# Patient Record
Sex: Female | Born: 1985 | Race: White | Hispanic: No | Marital: Married | Smoking: Never smoker
Health system: Southern US, Community
[De-identification: ages and names within clinical notes are randomized; demographics above are authoritative.]

## PROBLEM LIST (undated history)

## (undated) DIAGNOSIS — Z789 Other specified health status: Secondary | ICD-10-CM

## (undated) DIAGNOSIS — K219 Gastro-esophageal reflux disease without esophagitis: Secondary | ICD-10-CM

## (undated) HISTORY — PX: NO PAST SURGERIES: SHX2092

---

## 2010-08-12 ENCOUNTER — Inpatient Hospital Stay (HOSPITAL_COMMUNITY)
Admission: AD | Admit: 2010-08-12 | Discharge: 2010-08-12 | Payer: Self-pay | Source: Home / Self Care | Attending: Obstetrics and Gynecology | Admitting: Obstetrics and Gynecology

## 2010-08-12 LAB — URINALYSIS, ROUTINE W REFLEX MICROSCOPIC
Ketones, ur: >80 mg/dL — AB
Specific Gravity, Urine: >1.03 — ABNORMAL HIGH (ref 1.005–1.030)
Urine Glucose, Fasting: NEGATIVE mg/dL
pH: 6 (ref 5.0–8.0)

## 2010-08-12 LAB — URINE MICROSCOPIC-ADD ON

## 2010-10-17 ENCOUNTER — Inpatient Hospital Stay (HOSPITAL_COMMUNITY): Admission: AD | Admit: 2010-10-17 | Payer: Self-pay | Admitting: Obstetrics and Gynecology

## 2010-10-31 ENCOUNTER — Inpatient Hospital Stay (HOSPITAL_COMMUNITY)
Admission: AD | Admit: 2010-10-31 | Discharge: 2010-11-02 | DRG: 886 | Disposition: A | Discharge status: Home / Self Care | Payer: BC Managed Care – HMO | Source: Ambulatory Visit | Attending: Obstetrics and Gynecology | Admitting: Obstetrics and Gynecology

## 2010-10-31 ENCOUNTER — Inpatient Hospital Stay (HOSPITAL_COMMUNITY): Payer: BC Managed Care – HMO

## 2010-10-31 DIAGNOSIS — O26879 Cervical shortening, unspecified trimester: Secondary | ICD-10-CM

## 2010-10-31 DIAGNOSIS — O2390 Unspecified genitourinary tract infection in pregnancy, unspecified trimester: Secondary | ICD-10-CM

## 2010-10-31 DIAGNOSIS — N390 Urinary tract infection, site not specified: Secondary | ICD-10-CM

## 2010-10-31 LAB — URINALYSIS, ROUTINE W REFLEX MICROSCOPIC
Glucose, UA: NEGATIVE mg/dL
Ketones, ur: >80 mg/dL — AB
Protein, ur: NEGATIVE mg/dL
Urobilinogen, UA: 0.2 mg/dL (ref 0.0–1.0)

## 2010-10-31 LAB — FETAL FIBRONECTIN: Fetal Fibronectin: POSITIVE — AB

## 2010-10-31 LAB — WET PREP, GENITAL: Yeast Wet Prep HPF POC: NONE SEEN

## 2010-10-31 LAB — AMNISURE RUPTURE OF MEMBRANE (ROM) NOT AT ARMC: Amnisure ROM: NEGATIVE

## 2010-10-31 LAB — URINE MICROSCOPIC-ADD ON

## 2010-11-01 ENCOUNTER — Inpatient Hospital Stay (HOSPITAL_COMMUNITY): Payer: BC Managed Care – HMO

## 2010-11-01 LAB — URINE CULTURE

## 2010-11-02 ENCOUNTER — Inpatient Hospital Stay (HOSPITAL_COMMUNITY): Payer: BC Managed Care – HMO

## 2010-11-30 NOTE — Discharge Summary (Signed)
  NAMEARMIDA, Monique Hicks                 ACCOUNT NO.:  [id number]  MEDICAL RECORD NO.:  [id number]           PATIENT TYPE:  I  LOCATION:  9155                          FACILITY:  [facility]  PHYSICIAN:  Guy Sandifer. Henderson Cloud, M.D. DATE OF BIRTH:  May 02, 1986  DATE OF ADMISSION:  10/31/2010 DATE OF DISCHARGE:  11/02/2010                              DISCHARGE SUMMARY   ADMITTING DIAGNOSES: 1. Intrauterine pregnancy at 87 weeks' estimated additional age. 2. Shortened cervix.  DISCHARGE DIAGNOSES: 1. Intrauterine pregnancy at 26 weeks' estimated additional age. 2. Shortened cervix.  REASON FOR ADMISSION:  This patient is a 25 year old white female G3, P0 with an EDC of January 04, 2011 who over the weekend had a small amount of leaking of fluid.  No blood.  She then had some discomfort and presented to [facility] for evaluation.  While there she was noted to have positive fetal fibronectin and negative Amnisure.  Ultrasound revealed normal amniotic fluid and the cervical length of 2.2 cm with funneling. She was admitted for bedrest, observation, betamethasone.  She was given oral Procardia p.r.n. and oral Keflex for possible urinary tract infection.  HOSPITAL COURSE:  The patient remains stable and afebrile.  There were no complaints of further leaking.  Urine culture returns as negative. She receives both doses of betamethasone.  On November 02, 2010 ultrasound reveals a cervical length of 2.8 cm.  AFI was normal.  Fetal heart tones were reactive and there were no contractions.  CONDITION ON DISCHARGE:  Stable.  DIET:  Regular as tolerated.  MEDICATIONS:  Prenatal vitamins daily.  ACTIVITY:  Modified bedrest is carefully discussed with the patient. She will lie down at least 50% of the day.  Pelvic rest, no heavy lifting also emphasized.  Follow up in the office in 1 week.     Guy Sandifer Henderson Cloud, M.D.     JET/MEDQ  D:  11/02/2010  T:  11/02/2010  Job:  [id number]  Electronically Signed  by Harold Hedge M.D. on 11/30/2010 01:45:08 PM

## 2010-12-29 ENCOUNTER — Inpatient Hospital Stay (HOSPITAL_COMMUNITY)
Admission: AD | Admit: 2010-12-29 | Discharge: 2010-12-29 | Disposition: A | Discharge status: Home / Self Care | Payer: BC Managed Care – HMO | Source: Ambulatory Visit | Attending: Obstetrics and Gynecology | Admitting: Obstetrics and Gynecology

## 2010-12-29 DIAGNOSIS — O479 False labor, unspecified: Secondary | ICD-10-CM | POA: Insufficient documentation

## 2011-01-04 ENCOUNTER — Inpatient Hospital Stay (HOSPITAL_COMMUNITY)
Admission: AD | Admit: 2011-01-04 | Discharge: 2011-01-07 | DRG: 373 | Disposition: A | Discharge status: Home / Self Care | Payer: BC Managed Care – PPO | Source: Ambulatory Visit | Attending: Obstetrics and Gynecology | Admitting: Obstetrics and Gynecology

## 2011-01-04 ENCOUNTER — Encounter (HOSPITAL_COMMUNITY): Payer: Self-pay | Admitting: *Deleted

## 2011-01-04 LAB — CBC
HCT: 31.7 % — ABNORMAL LOW (ref 36.0–46.0)
Hemoglobin: 10.5 g/dL — ABNORMAL LOW (ref 12.0–15.0)
MCHC: 33.1 g/dL (ref 30.0–36.0)
MCV: 82.8 fL (ref 78.0–100.0)
WBC: 13.2 10*3/uL — ABNORMAL HIGH (ref 4.0–10.5)

## 2011-01-06 LAB — CBC
HCT: 27.5 % — ABNORMAL LOW (ref 36.0–46.0)
Hemoglobin: 9.2 g/dL — ABNORMAL LOW (ref 12.0–15.0)
MCV: 82.8 fL (ref 78.0–100.0)
WBC: 21.2 10*3/uL — ABNORMAL HIGH (ref 4.0–10.5)

## 2011-01-06 LAB — ABO/RH: ABO/RH(D): O POS

## 2011-03-14 ENCOUNTER — Emergency Department (HOSPITAL_COMMUNITY)
Admission: EM | Admit: 2011-03-14 | Discharge: 2011-03-14 | Disposition: A | Discharge status: Home / Self Care | Payer: BC Managed Care – PPO | Attending: Emergency Medicine | Admitting: Emergency Medicine

## 2011-03-14 DIAGNOSIS — K5289 Other specified noninfective gastroenteritis and colitis: Secondary | ICD-10-CM | POA: Insufficient documentation

## 2011-03-14 DIAGNOSIS — R1012 Left upper quadrant pain: Secondary | ICD-10-CM | POA: Insufficient documentation

## 2011-03-14 LAB — URINALYSIS, ROUTINE W REFLEX MICROSCOPIC
Hgb urine dipstick: NEGATIVE
Specific Gravity, Urine: 1.03 (ref 1.005–1.030)
Urobilinogen, UA: 1 mg/dL (ref 0.0–1.0)

## 2011-03-14 LAB — CBC
HCT: 35.4 % — ABNORMAL LOW (ref 36.0–46.0)
Hemoglobin: 11.8 g/dL — ABNORMAL LOW (ref 12.0–15.0)
MCH: 27.3 pg (ref 26.0–34.0)
MCHC: 33.3 g/dL (ref 30.0–36.0)
MCV: 81.9 fL (ref 78.0–100.0)

## 2011-03-14 LAB — URINE MICROSCOPIC-ADD ON

## 2011-03-14 LAB — POCT I-STAT, CHEM 8
Calcium, Ion: 1.2 mmol/L (ref 1.12–1.32)
Creatinine, Ser: 0.8 mg/dL (ref 0.50–1.10)
Glucose, Bld: 86 mg/dL (ref 70–99)
Hemoglobin: 12.6 g/dL (ref 12.0–15.0)
TCO2: 23 mmol/L (ref 0–100)

## 2011-03-14 LAB — DIFFERENTIAL
Lymphocytes Relative: 20 % (ref 12–46)
Monocytes Absolute: 1 10*3/uL (ref 0.1–1.0)
Monocytes Relative: 8 % (ref 3–12)
Neutro Abs: 8.4 10*3/uL — ABNORMAL HIGH (ref 1.7–7.7)

## 2011-03-15 LAB — URINE CULTURE: Culture: NO GROWTH

## 2011-03-30 ENCOUNTER — Ambulatory Visit (INDEPENDENT_AMBULATORY_CARE_PROVIDER_SITE_OTHER): Payer: Self-pay | Admitting: General Surgery

## 2011-04-14 ENCOUNTER — Encounter (INDEPENDENT_AMBULATORY_CARE_PROVIDER_SITE_OTHER): Payer: Self-pay | Admitting: General Surgery

## 2013-06-20 ENCOUNTER — Encounter: Payer: BC Managed Care – PPO | Admitting: Obstetrics & Gynecology

## 2013-06-20 DIAGNOSIS — Z3200 Encounter for pregnancy test, result unknown: Secondary | ICD-10-CM

## 2013-07-18 NOTE — L&D Delivery Note (Signed)
Delivery Note Progressed to complete dilation and began having intermittent deep variable decels. Dr Langston MaskerMorris was in the OR so I was standing by.  At 3:32 AM a viable and healthy female was delivered via Vaginal, Spontaneous Delivery (Presentation: ; Occiput Anterior).   No difficulty with shoulders.  APGAR: 8, 9; weight .   Placenta status: Undelivered.   Cord: 3 vessels with the following complications: None.    Anesthesia: Epidural  Episiotomy: None Lacerations: 1st degree Suture Repair: 3.0 vicryl rapide Est. Blood Loss (mL):   Mom to postpartum.  Baby to Couplet care / Skin to Skin  Care turned over to Dr Langston MaskerMorris for delivery of placenta.  [facility] HospitalWILLIAMS,Jurgen Groeneveld 01/28/2014, 3:53 AM

## 2013-08-02 LAB — OB RESULTS CONSOLE ABO/RH: RH Type: POSITIVE

## 2013-08-02 LAB — OB RESULTS CONSOLE HIV ANTIBODY (ROUTINE TESTING): HIV: NONREACTIVE

## 2013-08-02 LAB — OB RESULTS CONSOLE ANTIBODY SCREEN: Antibody Screen: NEGATIVE

## 2013-08-02 LAB — OB RESULTS CONSOLE RPR: RPR: NONREACTIVE

## 2013-08-02 LAB — OB RESULTS CONSOLE HEPATITIS B SURFACE ANTIGEN: Hepatitis B Surface Ag: NEGATIVE

## 2013-08-02 LAB — OB RESULTS CONSOLE RUBELLA ANTIBODY, IGM: Rubella: IMMUNE

## 2014-01-16 LAB — OB RESULTS CONSOLE GBS: STREP GROUP B AG: NEGATIVE

## 2014-01-27 ENCOUNTER — Encounter (HOSPITAL_COMMUNITY): Payer: BC Managed Care – PPO | Admitting: Anesthesiology

## 2014-01-27 ENCOUNTER — Inpatient Hospital Stay (HOSPITAL_COMMUNITY): Payer: BC Managed Care – PPO | Admitting: Anesthesiology

## 2014-01-27 ENCOUNTER — Inpatient Hospital Stay (HOSPITAL_COMMUNITY)
Admission: AD | Admit: 2014-01-27 | Discharge: 2014-01-29 | DRG: 774 | Disposition: A | Discharge status: Home / Self Care | Payer: BC Managed Care – PPO | Source: Ambulatory Visit | Attending: Obstetrics & Gynecology | Admitting: Obstetrics & Gynecology

## 2014-01-27 ENCOUNTER — Encounter (HOSPITAL_COMMUNITY): Payer: Self-pay | Admitting: *Deleted

## 2014-01-27 DIAGNOSIS — Z3490 Encounter for supervision of normal pregnancy, unspecified, unspecified trimester: Secondary | ICD-10-CM

## 2014-01-27 DIAGNOSIS — K219 Gastro-esophageal reflux disease without esophagitis: Secondary | ICD-10-CM | POA: Diagnosis present

## 2014-01-27 HISTORY — DX: Other specified health status: Z78.9

## 2014-01-27 HISTORY — DX: Gastro-esophageal reflux disease without esophagitis: K21.9

## 2014-01-27 LAB — CBC
HEMATOCRIT: 33.2 % — AB (ref 36.0–46.0)
HEMOGLOBIN: 11 g/dL — AB (ref 12.0–15.0)
MCH: 27.5 pg (ref 26.0–34.0)
MCHC: 33.1 g/dL (ref 30.0–36.0)
MCV: 83 fL (ref 78.0–100.0)
Platelets: 258 10*3/uL (ref 150–400)
RBC: 4 MIL/uL (ref 3.87–5.11)
RDW: 13.9 % (ref 11.5–15.5)
WBC: 17.8 10*3/uL — ABNORMAL HIGH (ref 4.0–10.5)

## 2014-01-27 MED ORDER — EPHEDRINE 5 MG/ML INJ
10.0000 mg | INTRAVENOUS | Status: DC | PRN
  Filled 2014-01-27: qty 2

## 2014-01-27 MED ORDER — OXYTOCIN BOLUS FROM INFUSION
500.0000 mL | INTRAVENOUS | Status: DC
  Administered 2014-01-28: 500 mL via INTRAVENOUS

## 2014-01-27 MED ORDER — OXYCODONE-ACETAMINOPHEN 5-325 MG PO TABS: 1.0000 | ORAL_TABLET | ORAL | Status: DC | PRN

## 2014-01-27 MED ORDER — ONDANSETRON HCL 4 MG/2ML IJ SOLN: 4.0000 mg | Freq: Four times a day (QID) | INTRAMUSCULAR | Status: DC | PRN

## 2014-01-27 MED ORDER — CITRIC ACID-SODIUM CITRATE 334-500 MG/5ML PO SOLN: 30.0000 mL | ORAL | Status: DC | PRN

## 2014-01-27 MED ORDER — OXYTOCIN 40 UNITS IN LACTATED RINGERS INFUSION - SIMPLE MED
62.5000 mL/h | INTRAVENOUS | Status: DC
  Administered 2014-01-28: 62.5 mL/h via INTRAVENOUS
  Filled 2014-01-27: qty 1000

## 2014-01-27 MED ORDER — LIDOCAINE HCL (PF) 1 % IJ SOLN
30.0000 mL | INTRAMUSCULAR | Status: DC | PRN
  Filled 2014-01-27: qty 30

## 2014-01-27 MED ORDER — FLEET ENEMA 7-19 GM/118ML RE ENEM: 1.0000 | ENEMA | RECTAL | Status: DC | PRN

## 2014-01-27 MED ORDER — FENTANYL 2.5 MCG/ML BUPIVACAINE 1/10 % EPIDURAL INFUSION (WH - ANES)
14.0000 mL/h | INTRAMUSCULAR | Status: DC | PRN
  Administered 2014-01-27: 14 mL/h via EPIDURAL
  Filled 2014-01-27: qty 125

## 2014-01-27 MED ORDER — LACTATED RINGERS IV SOLN
500.0000 mL | Freq: Once | INTRAVENOUS | Status: AC
  Administered 2014-01-27: 500 mL via INTRAVENOUS

## 2014-01-27 MED ORDER — LACTATED RINGERS IV SOLN: 500.0000 mL | INTRAVENOUS | Status: DC | PRN

## 2014-01-27 MED ORDER — ACETAMINOPHEN 325 MG PO TABS: 650.0000 mg | ORAL_TABLET | ORAL | Status: DC | PRN

## 2014-01-27 MED ORDER — DIPHENHYDRAMINE HCL 50 MG/ML IJ SOLN: 12.5000 mg | INTRAMUSCULAR | Status: DC | PRN

## 2014-01-27 MED ORDER — IBUPROFEN 600 MG PO TABS
600.0000 mg | ORAL_TABLET | Freq: Four times a day (QID) | ORAL | Status: DC | PRN
  Administered 2014-01-28: 600 mg via ORAL
  Filled 2014-01-27: qty 1

## 2014-01-27 MED ORDER — LACTATED RINGERS IV SOLN
INTRAVENOUS | Status: DC
  Administered 2014-01-27: 22:00:00 via INTRAVENOUS

## 2014-01-27 MED ORDER — PHENYLEPHRINE 40 MCG/ML (10ML) SYRINGE FOR IV PUSH (FOR BLOOD PRESSURE SUPPORT)
80.0000 ug | PREFILLED_SYRINGE | INTRAVENOUS | Status: DC | PRN
  Filled 2014-01-27: qty 10
  Filled 2014-01-27: qty 2

## 2014-01-27 MED ORDER — PHENYLEPHRINE 40 MCG/ML (10ML) SYRINGE FOR IV PUSH (FOR BLOOD PRESSURE SUPPORT)
80.0000 ug | PREFILLED_SYRINGE | INTRAVENOUS | Status: DC | PRN
  Filled 2014-01-27: qty 2

## 2014-01-27 NOTE — Anesthesia Preprocedure Evaluation (Signed)
Anesthesia Evaluation  Patient identified by MRN, date of birth, ID band Patient awake    Reviewed: Allergy & Precautions, H&P , NPO status , Patient's Chart, lab work & pertinent test results, reviewed documented beta blocker date and time   History of Anesthesia Complications Negative for: history of anesthetic complications  Airway Mallampati: III TM Distance: >3 FB Neck ROM: full    Dental  (+) Teeth Intact   Pulmonary neg pulmonary ROS,  breath sounds clear to auscultation        Cardiovascular negative cardio ROS  Rhythm:regular Rate:Normal     Neuro/Psych negative neurological ROS  negative psych ROS   GI/Hepatic Neg liver ROS, GERD-  Medicated,  Endo/Other  negative endocrine ROS  Renal/GU negative Renal ROS     Musculoskeletal   Abdominal   Peds  Hematology negative hematology ROS (+)   Anesthesia Other Findings   Reproductive/Obstetrics (+) Pregnancy                           Anesthesia Physical Anesthesia Plan  ASA: II  Anesthesia Plan: Epidural   Post-op Pain Management:    Induction:   Airway Management Planned:   Additional Equipment:   Intra-op Plan:   Post-operative Plan:   Informed Consent: I have reviewed the patients History and Physical, chart, labs and discussed the procedure including the risks, benefits and alternatives for the proposed anesthesia with the patient or authorized representative who has indicated his/her understanding and acceptance.     Plan Discussed with:   Anesthesia Plan Comments:         Anesthesia Quick Evaluation

## 2014-01-27 NOTE — H&P (Signed)
Monique Hicks is a 28 y.o. female presenting for labor.  She began having CTX yesterday and they increased in frequency and intensity.  No LOF or VB.  +FM.  Antepartum course uncomplicated.  GBS negative.  Maternal Medical History:  Reason for admission: Contractions.   Contractions: Onset was yesterday.   Frequency: irregular.   Perceived severity is mild.    Fetal activity: Perceived fetal activity is normal.   Last perceived fetal movement was within the past hour.    Prenatal complications: no prenatal complications Prenatal Complications - Diabetes: none.    OB History   Grav Para Term Preterm Abortions TAB SAB Ect Mult Living   2 1 1       1      Past Medical History  Diagnosis Date  . Medical history non-contributory   . GERD (gastroesophageal reflux disease)    Past Surgical History  Procedure Laterality Date  . No past surgeries     Family History: family history is not on file. Social History:  reports that she has never smoked. She does not have any smokeless tobacco history on file. She reports that she does not drink alcohol or use illicit drugs.   Prenatal Transfer Tool  Maternal Diabetes: No Genetic Screening: Normal Maternal Ultrasounds/Referrals: Normal Fetal Ultrasounds or other Referrals:  None Maternal Substance Abuse:  No Significant Maternal Medications:  None Significant Maternal Lab Results:  Lab values include: Group B Strep negative Other Comments:  None  ROS  Dilation: 5 Effacement (%): 90 Station: -2 Exam by:: L.Paschal, RN Blood pressure 123/83, pulse 102, temperature 98.1 F (36.7 C), temperature source Oral, resp. rate 18, height 5' (1.524 m), weight 168 lb (76.204 kg), last menstrual period 05/01/2013, SpO2 99.00%. Maternal Exam:  Uterine Assessment: Contraction strength is mild.  Contraction frequency is irregular.   Abdomen: Patient reports no abdominal tenderness. Fundal height is c/w dates.   Estimated fetal weight is 8#.        Physical Exam  Constitutional: She is oriented to person, place, and time. She appears well-developed and well-nourished.  GI: Soft. There is no rebound and no guarding.  Neurological: She is alert and oriented to person, place, and time.  Skin: Skin is warm and dry.  Psychiatric: She has a normal mood and affect. Her behavior is normal.    Prenatal labs: ABO, Rh:   Antibody:   Rubella:   RPR:    HBsAg:    HIV:    GBS: Negative (07/02 0000)   Assessment/Plan: 28yo G2P1001 at 1338w5d with labor -Anticipate NSVD -Epidural when ready -AROM and add pitocin prn   Monique Hicks 01/27/2014, 10:12 PM

## 2014-01-27 NOTE — Anesthesia Procedure Notes (Signed)
Epidural Patient location during procedure: OB Start time: 01/27/2014 11:53 PM  Staffing Performed by: anesthesiologist   Preanesthetic Checklist Completed: patient identified, site marked, surgical consent, pre-op evaluation, timeout performed, IV checked, risks and benefits discussed and monitors and equipment checked  Epidural Patient position: sitting Prep: site prepped and draped and DuraPrep Patient monitoring: continuous pulse ox and blood pressure Approach: midline Location: L3-L4 Injection technique: LOR air  Needle:  Needle type: Tuohy  Needle gauge: 17 G Needle length: 9 cm and 9 Needle insertion depth: 5.5 cm Catheter type: closed end flexible Catheter size: 19 Gauge Catheter at skin depth: 10.5 cm Test dose: negative  Assessment Events: blood not aspirated, injection not painful, no injection resistance, negative IV test and no paresthesia  Additional Notes Discussed risk of headache, infection, bleeding, nerve injury and failed or incomplete block.  Patient voices understanding and wishes to proceed.  Epidural placed easily on first attempt.  No paresthesia.  Some heme with first pass of catheter - removed and needle flushed with additional local.  Catheter then threaded easily.  Patient tolerated procedure well.  Jasmine DecemberA. Jacquiline Zurcher, MD Reason for block:procedure for pain

## 2014-01-27 NOTE — MAU Note (Signed)
Pt was seen in office at 1700.  VE was 4-5 cm with bulging membranes.  Pt states she been having U/C's today every 2-4 minutes.  Good fetal movement.  Bloody show since vaginal exam today.  No ROM.

## 2014-01-28 ENCOUNTER — Encounter (HOSPITAL_COMMUNITY): Payer: Self-pay

## 2014-01-28 LAB — URINALYSIS, ROUTINE W REFLEX MICROSCOPIC
Bilirubin Urine: NEGATIVE
GLUCOSE, UA: NEGATIVE mg/dL
Hgb urine dipstick: NEGATIVE
KETONES UR: 40 mg/dL — AB
LEUKOCYTES UA: NEGATIVE
NITRITE: NEGATIVE
PROTEIN: NEGATIVE mg/dL
Specific Gravity, Urine: 1.01 (ref 1.005–1.030)
Urobilinogen, UA: 0.2 mg/dL (ref 0.0–1.0)
pH: 6.5 (ref 5.0–8.0)

## 2014-01-28 LAB — RPR

## 2014-01-28 LAB — TYPE AND SCREEN
ABO/RH(D): O POS
ANTIBODY SCREEN: NEGATIVE

## 2014-01-28 MED ORDER — ONDANSETRON HCL 4 MG PO TABS: 4.0000 mg | ORAL_TABLET | ORAL | Status: DC | PRN

## 2014-01-28 MED ORDER — DIPHENHYDRAMINE HCL 25 MG PO CAPS: 25.0000 mg | ORAL_CAPSULE | Freq: Four times a day (QID) | ORAL | Status: DC | PRN

## 2014-01-28 MED ORDER — ONDANSETRON HCL 4 MG/2ML IJ SOLN: 4.0000 mg | INTRAMUSCULAR | Status: DC | PRN

## 2014-01-28 MED ORDER — WITCH HAZEL-GLYCERIN EX PADS: 1.0000 "application " | MEDICATED_PAD | CUTANEOUS | Status: DC | PRN

## 2014-01-28 MED ORDER — OXYTOCIN 40 UNITS IN LACTATED RINGERS INFUSION - SIMPLE MED
1.0000 m[IU]/min | INTRAVENOUS | Status: DC
  Administered 2014-01-28: 2 m[IU]/min via INTRAVENOUS

## 2014-01-28 MED ORDER — TETANUS-DIPHTH-ACELL PERTUSSIS 5-2.5-18.5 LF-MCG/0.5 IM SUSP: 0.5000 mL | Freq: Once | INTRAMUSCULAR | Status: DC

## 2014-01-28 MED ORDER — SENNOSIDES-DOCUSATE SODIUM 8.6-50 MG PO TABS
2.0000 | ORAL_TABLET | ORAL | Status: DC
  Administered 2014-01-28: 2 via ORAL
  Filled 2014-01-28: qty 2

## 2014-01-28 MED ORDER — PRENATAL MULTIVITAMIN CH
1.0000 | ORAL_TABLET | Freq: Every day | ORAL | Status: DC
  Administered 2014-01-28: 1 via ORAL
  Filled 2014-01-28: qty 1

## 2014-01-28 MED ORDER — OXYCODONE-ACETAMINOPHEN 5-325 MG PO TABS: 1.0000 | ORAL_TABLET | ORAL | Status: DC | PRN

## 2014-01-28 MED ORDER — LIDOCAINE HCL (PF) 1 % IJ SOLN
INTRAMUSCULAR | Status: DC | PRN
  Administered 2014-01-27 – 2014-01-28 (×4): 4 mL

## 2014-01-28 MED ORDER — DOXYCYCLINE HYCLATE 100 MG PO TABS
100.0000 mg | ORAL_TABLET | Freq: Two times a day (BID) | ORAL | Status: DC
  Administered 2014-01-28 – 2014-01-29 (×3): 100 mg via ORAL
  Filled 2014-01-28 (×4): qty 1

## 2014-01-28 MED ORDER — TERBUTALINE SULFATE 1 MG/ML IJ SOLN: 0.2500 mg | Freq: Once | INTRAMUSCULAR | Status: DC | PRN

## 2014-01-28 MED ORDER — ZOLPIDEM TARTRATE 5 MG PO TABS: 5.0000 mg | ORAL_TABLET | Freq: Every evening | ORAL | Status: DC | PRN

## 2014-01-28 MED ORDER — SIMETHICONE 80 MG PO CHEW
80.0000 mg | CHEWABLE_TABLET | ORAL | Status: DC | PRN
  Administered 2014-01-28 (×2): 80 mg via ORAL
  Filled 2014-01-28 (×2): qty 1

## 2014-01-28 MED ORDER — BENZOCAINE-MENTHOL 20-0.5 % EX AERO: 1.0000 "application " | INHALATION_SPRAY | CUTANEOUS | Status: DC | PRN

## 2014-01-28 MED ORDER — DIBUCAINE 1 % RE OINT: 1.0000 "application " | TOPICAL_OINTMENT | RECTAL | Status: DC | PRN

## 2014-01-28 MED ORDER — LANOLIN HYDROUS EX OINT: TOPICAL_OINTMENT | CUTANEOUS | Status: DC | PRN

## 2014-01-28 MED ORDER — IBUPROFEN 600 MG PO TABS
600.0000 mg | ORAL_TABLET | Freq: Four times a day (QID) | ORAL | Status: DC
  Administered 2014-01-28 – 2014-01-29 (×5): 600 mg via ORAL
  Filled 2014-01-28 (×5): qty 1

## 2014-01-28 NOTE — Lactation Note (Signed)
This note was copied from the chart of Monique Allene PyoSarah Hicks. Lactation Consultation Note  Patient Name: Monique Hicks ZOXWR'UToday's Date: 01/28/2014 Reason for consult: Initial assessment;Infant < 6lbs born at 38 weeks and 6 days. Mom nursed her first child for 9 months and pumped and provided ebm until one year.  This child is now 483 yo but mom said her milk supply was delayed coming in and she did do some early supplementation with first baby.  This newborn is latching well, per mom and she hears swallows.  Output wnl and baby >38  weeks but under 6 pounds.  LC discussed option of pumping or hand expressing and spoon feeding ebm or formula as needed but based on current assessment, cue feedings ad lib recommended and frequent STS.   Mom encouraged to feed baby 8-12 times/24 hours and with feeding cues.  LC provided [employer] Resource brochure and reviewed [facility] services and list of community and web site resources. LC encouraged review of Baby and Me pp 9, 14 and 20-25 for STS and BF information.   Maternal Data Formula Feeding for Exclusion: No Infant to breast within first hour of birth: Yes (initial LATCH score=7) Has patient been taught Hand Expression?: Yes (mom states she knows how to hand express her milk) Does the patient have breastfeeding experience prior to this delivery?: Yes  Feeding    LATCH Score/Interventions      Initial LATCH score=7 after delivery and baby has fed 4 times since birth, now 2419 hours of age and 2 voids/2 stools                Lactation Tools Discussed/Used   STS, hand expression, cue feedings Options for baby not feeding well based on weight under 6 pounds  Consult Status Consult Status: Follow-up Date: 01/29/14 Follow-up type: In-patient    Warrick ParisianBryant, Kendallyn Lippold [facility] 01/28/2014, 10:58 PM

## 2014-01-28 NOTE — Progress Notes (Signed)
Patient completely dilated and feeling pressure. Dr. Langston MaskerMorris called and made aware but is currently in OR. Dr. Langston MaskerMorris requests that I call faculty as backup. Faculty midwife Mayford KnifeWilliams called and made aware of pt status and requests that she be present for delivery when called.

## 2014-01-28 NOTE — Anesthesia Postprocedure Evaluation (Signed)
  Anesthesia Post-op Note  Patient: Monique Hicks  Procedure(s) Performed: * No procedures listed *  Patient Location: Mother/Baby  Anesthesia Type:Epidural  Level of Consciousness: awake, alert , oriented and patient cooperative  Airway and Oxygen Therapy: Patient Spontanous Breathing  Post-op Pain: none  Post-op Assessment: Patient's Cardiovascular Status Stable, Respiratory Function Stable, Patent Airway, No signs of Nausea or vomiting, Adequate PO intake, Pain level controlled, No headache, No backache, No residual numbness and No residual motor weakness  Post-op Vital Signs: Reviewed and stable  Last Vitals:  Filed Vitals:   01/28/14 0658  BP: 100/58  Pulse: 99  Temp: 36.9 C  Resp: 18    Complications: No apparent anesthesia complications

## 2014-01-28 NOTE — Progress Notes (Signed)
Post Partum Day 0 Subjective: no complaints, up ad lib and tolerating PO  Objective: Blood pressure 100/58, pulse 99, temperature 98.5 F (36.9 C), temperature source Oral, resp. rate 18, height 5' (1.524 m), weight 168 lb (76.204 kg), last menstrual period 05/01/2013, SpO2 99.00%, unknown if currently breastfeeding.  Physical Exam:  General: alert and cooperative Lochia: appropriate Uterine Fundus: firm Incision: perineum intact DVT Evaluation: No evidence of DVT seen on physical exam. Negative Homan's sign. No cords or calf tenderness. No significant calf/ankle edema.   Recent Labs  01/27/14 2105  HGB 11.0*  HCT 33.2*    Assessment/Plan: Plan for discharge tomorrow Desires circ prior to discharge  LOS: 1 day   Yisroel Mullendore G 01/28/2014, 8:11 AM

## 2014-01-28 NOTE — Op Note (Addendum)
Was starting C/S when I was informed that Monique Hicks was complete and feeling pressure.  I requested that house staff be on call and that I would come to the patient's room as soon as I was able.  After finishing in the OR, I came to her room and the baby was on the mother's abdomen.  Placenta was still in place.  After 30 minutes, manual extraction of the placenta was performed.  The placenta was removed in pieces and banjo curettage was performed until a manual sweep revealed no remaining products.  The patient tolerated well.  Will give 1 week of pp abx.  Mitchel HonourMegan Brennan Litzinger, DO

## 2014-01-29 LAB — CBC
HEMATOCRIT: 27.7 % — AB (ref 36.0–46.0)
Hemoglobin: 9.3 g/dL — ABNORMAL LOW (ref 12.0–15.0)
MCH: 28.3 pg (ref 26.0–34.0)
MCHC: 33.6 g/dL (ref 30.0–36.0)
MCV: 84.2 fL (ref 78.0–100.0)
Platelets: 235 10*3/uL (ref 150–400)
RBC: 3.29 MIL/uL — AB (ref 3.87–5.11)
RDW: 14.1 % (ref 11.5–15.5)
WBC: 13.5 10*3/uL — AB (ref 4.0–10.5)

## 2014-01-29 MED ORDER — IBUPROFEN 600 MG PO TABS: 600.0000 mg | ORAL_TABLET | Freq: Four times a day (QID) | ORAL | Status: DC

## 2014-01-29 NOTE — Discharge Summary (Signed)
Obstetric Discharge Summary Reason for Admission: onset of labor Prenatal Procedures: ultrasound Intrapartum Procedures: spontaneous vaginal delivery Postpartum Procedures: none Complications-Operative and Postpartum: 1 degree perineal laceration Hemoglobin  Date Value Ref Range Status  01/29/2014 9.3* 12.0 - 15.0 g/dL Final     HCT  Date Value Ref Range Status  01/29/2014 27.7* 36.0 - 46.0 % Final    Physical Exam:  General: alert and cooperative Lochia: appropriate Uterine Fundus: firm Incision: perineum intact DVT Evaluation: No evidence of DVT seen on physical exam. Negative Homan's sign. No cords or calf tenderness. No significant calf/ankle edema.  Discharge Diagnoses: Term Pregnancy-delivered  Discharge Information: Date: 01/29/2014 Activity: pelvic rest Diet: routine Medications: PNV and Ibuprofen Condition: stable Instructions: refer to practice specific booklet Discharge to: home   Newborn Data: Live born female  Birth Weight: 5 lb 7.7 oz (2485 g) APGAR: 8, 9  Home with mother.  Monique Hicks G 01/29/2014, 8:05 AM

## 2014-05-19 ENCOUNTER — Encounter (HOSPITAL_COMMUNITY): Payer: Self-pay

## 2014-09-16 ENCOUNTER — Ambulatory Visit: Payer: Self-pay | Admitting: Licensed Clinical Social Worker

## 2014-09-23 ENCOUNTER — Ambulatory Visit (INDEPENDENT_AMBULATORY_CARE_PROVIDER_SITE_OTHER): Payer: BLUE CROSS/BLUE SHIELD | Admitting: Licensed Clinical Social Worker

## 2014-09-23 DIAGNOSIS — F419 Anxiety disorder, unspecified: Secondary | ICD-10-CM

## 2014-10-02 ENCOUNTER — Ambulatory Visit (INDEPENDENT_AMBULATORY_CARE_PROVIDER_SITE_OTHER): Payer: BLUE CROSS/BLUE SHIELD | Admitting: Licensed Clinical Social Worker

## 2014-10-02 DIAGNOSIS — F411 Generalized anxiety disorder: Secondary | ICD-10-CM

## 2014-10-14 ENCOUNTER — Ambulatory Visit: Payer: BLUE CROSS/BLUE SHIELD | Admitting: Licensed Clinical Social Worker

## 2014-11-26 DIAGNOSIS — K5730 Diverticulosis of large intestine without perforation or abscess without bleeding: Secondary | ICD-10-CM | POA: Insufficient documentation

## 2015-05-22 ENCOUNTER — Encounter: Payer: Self-pay | Admitting: Family Medicine

## 2015-05-22 ENCOUNTER — Ambulatory Visit (INDEPENDENT_AMBULATORY_CARE_PROVIDER_SITE_OTHER): Payer: BLUE CROSS/BLUE SHIELD | Admitting: Family Medicine

## 2015-05-22 VITALS — BP 121/84 | HR 81 | Ht 59.0 in | Wt 130.0 lb

## 2015-05-22 DIAGNOSIS — M25531 Pain in right wrist: Secondary | ICD-10-CM

## 2015-05-22 NOTE — Patient Instructions (Signed)
You have an extensor tendinitis of your index and middle fingers. Work restrictions as noted for next 6 weeks. Icing 15 minutes at a time 3-4 times a day at least. Ibuprofen 600mg  three times a day with food OR aleve 2 tabs twice a day with food for pain and inflammation. Wear wrist brace as often as possible during the day. Consider occupational therapy, nitro patches if not improving as expected. Follow up with me in 6 weeks.

## 2015-05-26 DIAGNOSIS — M25531 Pain in right wrist: Secondary | ICD-10-CM | POA: Insufficient documentation

## 2015-05-26 NOTE — Assessment & Plan Note (Signed)
consistent with an extensor tendinitis.  Icing, nsaids, wrist brace as often as possible.  Restrictions at work - letter provided.  Consider nitro patches, occupational therapy if not improving.  F/u in 6 weeks.

## 2015-05-26 NOTE — Progress Notes (Signed)
PCP: Daphane ShepherdLori Beane MD  Subjective:   HPI: Patient is a 29 y.o. female here for right wrist pain.  Patient denies known injury or trauma. She states for about a month she's had right wrist pain that radiates up her forearm on dorsal aspect. Pain level around 2/10 currently, dull. Worse with movements, pushing up from a seated position. Tried ibuprofen, aleve, BC powders. Not icing or using a brace. Feels like hand is getting weaker. No skin changes, fever, other complaints.  Past Medical History  Diagnosis Date  . Medical history non-contributory   . GERD (gastroesophageal reflux disease)     No current outpatient prescriptions on file prior to visit.   No current facility-administered medications on file prior to visit.    Past Surgical History  Procedure Laterality Date  . No past surgeries      No Known Allergies  Social History   Social History  . Marital Status: Married    Spouse Name: N/A  . Number of Children: N/A  . Years of Education: N/A   Occupational History  . Not on file.   Social History Main Topics  . Smoking status: Never Smoker   . Smokeless tobacco: Not on file  . Alcohol Use: No  . Drug Use: No  . Sexual Activity: Yes    Birth Control/ Protection: None   Other Topics Concern  . Not on file   Social History Narrative    No family history on file.  BP 121/84 mmHg  Pulse 81  Ht 4\' 11"  (1.499 m)  Wt 130 lb (58.968 kg)  BMI 26.24 kg/m2  Review of Systems: See HPI above.    Objective:  Physical Exam:  Gen: NAD  Right wrist: No gross deformity, swelling, bruising. TTP over extensor tendons of index and middle fingers.  No other hand, wrist tenderness. FROM digits and wrist with minimal pain on extension of 2nd, 3rd digits. Collateral ligaments intact of digits. Negative tinels, finkelsteins, phalens. NVI distally.  Left wrist: FROM without pain.    Assessment & Plan:  1. Right wrist pain - consistent with an extensor  tendinitis.  Icing, nsaids, wrist brace as often as possible.  Restrictions at work - letter provided.  Consider nitro patches, occupational therapy if not improving.  F/u in 6 weeks.

## 2018-03-20 ENCOUNTER — Ambulatory Visit (INDEPENDENT_AMBULATORY_CARE_PROVIDER_SITE_OTHER): Payer: BLUE CROSS/BLUE SHIELD

## 2018-03-20 ENCOUNTER — Ambulatory Visit (HOSPITAL_COMMUNITY)
Admission: EM | Admit: 2018-03-20 | Discharge: 2018-03-20 | Disposition: A | Discharge status: Home / Self Care | Payer: BLUE CROSS/BLUE SHIELD | Attending: Family Medicine | Admitting: Family Medicine

## 2018-03-20 ENCOUNTER — Encounter (HOSPITAL_COMMUNITY): Payer: Self-pay | Admitting: Emergency Medicine

## 2018-03-20 DIAGNOSIS — S93401A Sprain of unspecified ligament of right ankle, initial encounter: Secondary | ICD-10-CM

## 2018-03-20 DIAGNOSIS — J0100 Acute maxillary sinusitis, unspecified: Secondary | ICD-10-CM | POA: Diagnosis not present

## 2018-03-20 MED ORDER — MELOXICAM 7.5 MG PO TABS: 7.5000 mg | ORAL_TABLET | Freq: Every day | ORAL | 1 refills | Status: AC

## 2018-03-20 MED ORDER — AMOXICILLIN-POT CLAVULANATE 875-125 MG PO TABS: 1.0000 | ORAL_TABLET | Freq: Two times a day (BID) | ORAL | 0 refills | Status: AC

## 2018-03-20 NOTE — ED Triage Notes (Signed)
Pt here for right ankle pain since twisting 3 weeks ago; pt sts some swelling and pain

## 2018-03-20 NOTE — Discharge Instructions (Addendum)
It was nice meeting you!!  Your x-ray was negative for fracture We will give you an ASO brace to wear and meloxicam for pain and inflammation. You will  need to take this daily with food. Please try to ice and elevate the ankle is much as possible. If you are not getting any relief in the next couple weeks she may want to follow-up with orthopedic.

## 2018-03-20 NOTE — ED Provider Notes (Signed)
[facility]    CSN: [id number] Arrival date & time: 03/20/18  1354     History   Chief Complaint Chief Complaint  Patient presents with  . Foot Pain    appt 1406    HPI Monique Hicks is a 32 y.o. female.   She is a healthy 32 year old female that presents with 3 weeks of right ankle pain and swelling.  This started after she stepped in a hole 3 weeks ago and inverted her ankle.  Since the swelling has been persistent to the lateral malleolus. Swelling is worse after walking and being on her feet all day. She has been able to walk but very painful with certain shoes.  She has had some numbness and tingling at times in her foot and ankle.   She is also here for nasal congestion, sinus tenderness and intermittent nosebleeds x2 weeks. She attributes this to moving into a new house and the air being dry. Reports that also when they moved in the air quality was not good in the house and the vent filters had not been changed in a long time. She does have a hx of sinus infections. She is having dark green mucous that is thick at times. Denies any fever, chills, cough, congestion.   Healthy 32 year old female.  She does not smoke, no alcohol use, no drug use.  Parents are healthy  ROS per HPI      Past Medical History:  Diagnosis Date  . GERD (gastroesophageal reflux disease)   . Medical history non-contributory     Patient Active Problem List   Diagnosis Date Noted  . Right wrist pain 05/26/2015  . 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 Pregnancy 01/27/2014  . ADD (attention deficit disorder) 11/29/2007    Past Surgical History:  Procedure Laterality Date  . NO PAST SURGERIES      OB History    Gravida  2   Para  2   Term  2   Preterm      AB      Living  2     SAB      TAB      Ectopic      Multiple      Live Births  1            Home Medications    Prior to Admission medications   Medication Sig Start Date End Date Taking?  Authorizing Provider  amoxicillin-clavulanate (AUGMENTIN) 875-125 MG tablet Take 1 tablet by mouth every 12 (twelve) hours. 03/20/18   Lavonia Eager, Gloris Manchester A, NP  amphetamine-dextroamphetamine (ADDERALL XR) 20 MG 24 hr capsule Take 20 mg by mouth every morning. 03/30/15   [provider]  meloxicam (MOBIC) 7.5 MG tablet Take 1 tablet (7.5 mg total) by mouth daily. 03/20/18   Janace Aris, NP    Family History History reviewed. No pertinent family history.  Social History Social History   Tobacco Use  . Smoking status: Never Smoker  Substance Use Topics  . Alcohol use: No    Alcohol/week: 0.0 standard drinks  . Drug use: No     Allergies   Patient has no known allergies.   Review of Systems Review of Systems  Constitutional: Positive for activity change. Negative for appetite change, fatigue and fever.  HENT: Positive for congestion, rhinorrhea, sinus pressure and sinus pain. Negative for ear pain.   Respiratory: Negative for cough, chest tightness and shortness of breath.   Cardiovascular: Negative for  chest pain.     Physical Exam Triage Vital Signs ED Triage Vitals [03/20/18 1418]  Enc Vitals Group     BP 126/75     Pulse Rate 91     Resp 18     Temp 98.3 F (36.8 C)     Temp Source Oral     SpO2 98 %     Weight      Height      Head Circumference      Peak Flow      Pain Score      Pain Loc      Pain Edu?      Excl. in GC?    No data found.  Updated Vital Signs BP 126/75 (BP Location: Left Arm)   Pulse 91   Temp 98.3 F (36.8 C) (Oral)   Resp 18   SpO2 98%   Visual Acuity Right Eye Distance:   Left Eye Distance:   Bilateral Distance:    Right Eye Near:   Left Eye Near:    Bilateral Near:     Physical Exam  Constitutional: She is oriented to person, place, and time. She appears well-developed and well-nourished.  Very pleasant.   HENT:  Head: Normocephalic and atraumatic.  Right Ear: External ear normal.  Left Ear: External ear normal.    Nose: Nose normal.  Mouth/Throat: Oropharynx is clear and moist.  Frontal and maxillary sinus tenderness. Mild right nasal turbinate swelling and tender to right nares. No obvious drainage.   Eyes: Conjunctivae are normal.  Neck: Normal range of motion.  Pulmonary/Chest: Effort normal.  Musculoskeletal: Normal range of motion. She exhibits edema and tenderness. She exhibits no deformity.  Good flexion and extension of the right ankle. Able to invert and evert. Swelling to the lateral malleolus. Mild tenderness to palpation. No bruising or deformity. Pedal pulse intact. Sensation intact.   Neurological: She is alert and oriented to person, place, and time. No sensory deficit.  Skin: Skin is warm and dry. No erythema.  Psychiatric: She has a normal mood and affect.  Nursing note and vitals reviewed.    UC Treatments / Results  Labs (all labs ordered are listed, but only abnormal results are displayed) Labs Reviewed - No data to display  EKG None  Radiology Dg Ankle Complete Right  Result Date: 03/20/2018 CLINICAL DATA:  fall and twisted ankle 3 weeks ago. still having pain and swelling. EXAM: RIGHT ANKLE - COMPLETE 3+ VIEW COMPARISON:  No prior. FINDINGS: Mild soft tissue swelling. No acute bony or joint abnormality identified. No evidence of fracture or dislocation. IMPRESSION: Mild soft tissue swelling, otherwise negative exam. No acute bony abnormality. Electronically Signed   By: Maisie Fus  Register   On: 03/20/2018 14:54    Procedures Procedures (including critical care time)  Medications Ordered in UC Medications - No data to display  Initial Impression / Assessment and Plan / UC Course  I have reviewed the triage vital signs and the nursing notes.  Pertinent labs & imaging results that were available during my care of the patient were reviewed by me and considered in my medical decision making (see chart for details).     Ankle sprain- rest, ice elevate and ace  wrap meloxicam for pain and inflammation.  Follow up with ortho if no improvement in the next few weeks.  Augmentin for sinus infection Instructed to get Flonase over the counter, nettie pot to prevent further infections.  She can also try zyrtec daily.  Pt agreeable and understanding of plan.  Final Clinical Impressions(s) / UC Diagnoses   Final diagnoses:  Sprain of right ankle, unspecified ligament, initial encounter  Acute non-recurrent maxillary sinusitis     Discharge Instructions     It was nice meeting you!!  Your x-ray was negative for fracture We will give you an ASO brace to wear and meloxicam for pain and inflammation. You will  need to take this daily with food. Please try to ice and elevate the ankle is much as possible. If you are not getting any relief in the next couple weeks she may want to follow-up with orthopedic.     ED Prescriptions    Medication Sig Dispense Auth. Provider   meloxicam (MOBIC) 7.5 MG tablet Take 1 tablet (7.5 mg total) by mouth daily. 30 tablet Analysse Quinonez A, NP   amoxicillin-clavulanate (AUGMENTIN) 875-125 MG tablet Take 1 tablet by mouth every 12 (twelve) hours. 14 tablet Dahlia Byes A, NP     Controlled Substance Prescriptions [place] Controlled Substance Registry consulted? Not Applicable   Janace Aris, NP 03/20/18 [phone]

## 2018-08-12 ENCOUNTER — Emergency Department (HOSPITAL_COMMUNITY)
Admission: EM | Admit: 2018-08-12 | Discharge: 2018-08-12 | Disposition: A | Discharge status: Home / Self Care | Payer: BLUE CROSS/BLUE SHIELD | Attending: Emergency Medicine | Admitting: Emergency Medicine

## 2018-08-12 ENCOUNTER — Encounter (HOSPITAL_COMMUNITY): Payer: Self-pay | Admitting: Emergency Medicine

## 2018-08-12 ENCOUNTER — Emergency Department (HOSPITAL_COMMUNITY): Payer: BLUE CROSS/BLUE SHIELD

## 2018-08-12 DIAGNOSIS — K219 Gastro-esophageal reflux disease without esophagitis: Secondary | ICD-10-CM | POA: Diagnosis not present

## 2018-08-12 DIAGNOSIS — R1012 Left upper quadrant pain: Secondary | ICD-10-CM | POA: Diagnosis not present

## 2018-08-12 DIAGNOSIS — Z8679 Personal history of other diseases of the circulatory system: Secondary | ICD-10-CM | POA: Insufficient documentation

## 2018-08-12 DIAGNOSIS — Z79899 Other long term (current) drug therapy: Secondary | ICD-10-CM | POA: Insufficient documentation

## 2018-08-12 LAB — CBC WITH DIFFERENTIAL/PLATELET
ABS IMMATURE GRANULOCYTES: 0.01 10*3/uL (ref 0.00–0.07)
Basophils Absolute: 0 10*3/uL (ref 0.0–0.1)
Basophils Relative: 0 %
Eosinophils Absolute: 0.1 10*3/uL (ref 0.0–0.5)
Eosinophils Relative: 1 %
HCT: 42.4 % (ref 36.0–46.0)
Hemoglobin: 13.9 g/dL (ref 12.0–15.0)
Immature Granulocytes: 0 %
LYMPHS PCT: 34 %
Lymphs Abs: 2.5 10*3/uL (ref 0.7–4.0)
MCH: 28.7 pg (ref 26.0–34.0)
MCHC: 32.8 g/dL (ref 30.0–36.0)
MCV: 87.4 fL (ref 80.0–100.0)
MONO ABS: 0.8 10*3/uL (ref 0.1–1.0)
MONOS PCT: 10 %
Neutro Abs: 4 10*3/uL (ref 1.7–7.7)
Neutrophils Relative %: 55 %
Platelets: 290 10*3/uL (ref 150–400)
RBC: 4.85 MIL/uL (ref 3.87–5.11)
RDW: 12.8 % (ref 11.5–15.5)
WBC: 7.4 10*3/uL (ref 4.0–10.5)
nRBC: 0 % (ref 0.0–0.2)

## 2018-08-12 LAB — COMPREHENSIVE METABOLIC PANEL
ALK PHOS: 40 U/L (ref 38–126)
ALT: 15 U/L (ref 0–44)
AST: 19 U/L (ref 15–41)
Albumin: 4.2 g/dL (ref 3.5–5.0)
Anion gap: 9 (ref 5–15)
BUN: 11 mg/dL (ref 6–20)
CALCIUM: 9.5 mg/dL (ref 8.9–10.3)
CO2: 25 mmol/L (ref 22–32)
CREATININE: 0.63 mg/dL (ref 0.44–1.00)
Chloride: 101 mmol/L (ref 98–111)
GFR calc Af Amer: >60 mL/min (ref >60)
Glucose, Bld: 84 mg/dL (ref 70–99)
Potassium: 3.6 mmol/L (ref 3.5–5.1)
Sodium: 135 mmol/L (ref 135–145)
Total Bilirubin: 1.4 mg/dL — ABNORMAL HIGH (ref 0.3–1.2)
Total Protein: 7.1 g/dL (ref 6.5–8.1)

## 2018-08-12 LAB — I-STAT BETA HCG BLOOD, ED (MC, WL, AP ONLY): I-stat hCG, quantitative: <5 m[IU]/mL (ref <5)

## 2018-08-12 LAB — URINALYSIS, ROUTINE W REFLEX MICROSCOPIC
Bilirubin Urine: NEGATIVE
Glucose, UA: NEGATIVE mg/dL
Hgb urine dipstick: NEGATIVE
KETONES UR: NEGATIVE mg/dL
Leukocytes, UA: NEGATIVE
Nitrite: NEGATIVE
Protein, ur: NEGATIVE mg/dL
Specific Gravity, Urine: 1.008 (ref 1.005–1.030)
pH: 7 (ref 5.0–8.0)

## 2018-08-12 LAB — LIPASE, BLOOD: LIPASE: 36 U/L (ref 11–51)

## 2018-08-12 MED ORDER — SODIUM CHLORIDE 0.9 % IV BOLUS
1000.0000 mL | Freq: Once | INTRAVENOUS | Status: AC
  Administered 2018-08-12: 1000 mL via INTRAVENOUS

## 2018-08-12 MED ORDER — MORPHINE SULFATE (PF) 4 MG/ML IV SOLN
4.0000 mg | Freq: Once | INTRAVENOUS | Status: DC
  Filled 2018-08-12: qty 1

## 2018-08-12 MED ORDER — ONDANSETRON HCL 4 MG/2ML IJ SOLN
4.0000 mg | Freq: Once | INTRAMUSCULAR | Status: DC
Start: 2018-08-12 — End: 2018-08-12
  Filled 2018-08-12: qty 2

## 2018-08-12 NOTE — ED Notes (Signed)
Patient verbalizes understanding of discharge instructions. Opportunity for questioning and answers were provided. Armband removed by staff, pt discharged from ED.  

## 2018-08-12 NOTE — ED Triage Notes (Signed)
Sharp pain left upper abd that shoots thru to back since Thursday, nausea

## 2018-08-12 NOTE — ED Provider Notes (Signed)
[facility] EMERGENCY DEPARTMENT Provider Note   CSN: [id number] Arrival date & time: 08/12/18  1036     History   Chief Complaint Chief Complaint  Patient presents with  . Abdominal Pain    HPI Monique Hicks is a 33 y.o. female.  HPI Patient reports sharp upper and left upper quadrant abdominal pain that shoots through to her back since Thursday with some nausea.  Denies vomiting.  No diarrhea.  No fevers and chills.  History of gastroesophageal reflux disease.  No prior history of gallstones.  She reports the pain is coming and going.  She reports a longstanding history of sinus tachycardia.  She states is normal for heart rate to be in the high 90s to low 100s.  She denies fever and chills.  No dysuria or urinary frequency.   Past Medical History:  Diagnosis Date  . GERD (gastroesophageal reflux disease)   . Medical history non-contributory     Patient Active Problem List   Diagnosis Date Noted  . Right wrist pain 05/26/2015  . 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 Pregnancy 01/27/2014  . ADD (attention deficit disorder) 11/29/2007    Past Surgical History:  Procedure Laterality Date  . NO PAST SURGERIES       OB History    Gravida  2   Para  2   Term  2   Preterm      AB      Living  2     SAB      TAB      Ectopic      Multiple      Live Births  1            Home Medications    Prior to Admission medications   Medication Sig Start Date End Date Taking? Authorizing Provider  amoxicillin-clavulanate (AUGMENTIN) 875-125 MG tablet Take 1 tablet by mouth every 12 (twelve) hours. 03/20/18   Bast, Gloris Manchester A, NP  amphetamine-dextroamphetamine (ADDERALL XR) 20 MG 24 hr capsule Take 20 mg by mouth every morning. 03/30/15   [provider]  meloxicam (MOBIC) 7.5 MG tablet Take 1 tablet (7.5 mg total) by mouth daily. 03/20/18   Janace Aris, NP    Family History No family history on file.  Social History Social History     Tobacco Use  . Smoking status: Never Smoker  . Smokeless tobacco: Never Used  Substance Use Topics  . Alcohol use: No    Alcohol/week: 0.0 standard drinks  . Drug use: No     Allergies   Patient has no known allergies.   Review of Systems Review of Systems  All other systems reviewed and are negative.    Physical Exam Updated Vital Signs BP (!) 148/84 (BP Location: Right Arm)   Pulse (!) 116   Resp 16   SpO2 100%   Physical Exam Vitals signs and nursing note reviewed.  Constitutional:      General: She is not in acute distress.    Appearance: She is well-developed.  HENT:     Head: Normocephalic and atraumatic.  Neck:     Musculoskeletal: Normal range of motion.  Cardiovascular:     Rate and Rhythm: Normal rate and regular rhythm.     Heart sounds: Normal heart sounds.  Pulmonary:     Effort: Pulmonary effort is normal.     Breath sounds: Normal breath sounds.  Abdominal:     General: There is no distension.  Comments: Mild epigastric and left upper quadrant tenderness without guarding or rebound.  Musculoskeletal: Normal range of motion.  Skin:    General: Skin is warm and dry.  Neurological:     Mental Status: She is alert and oriented to person, place, and time.  Psychiatric:        Judgment: Judgment normal.      ED Treatments / Results  Labs (all labs ordered are listed, but only abnormal results are displayed) Labs Reviewed  COMPREHENSIVE METABOLIC PANEL - Abnormal; Notable for the following components:      Result Value   Total Bilirubin 1.4 (*)    All other components within normal limits  URINALYSIS, ROUTINE W REFLEX MICROSCOPIC - Abnormal; Notable for the following components:   Color, Urine STRAW (*)    All other components within normal limits  CBC WITH DIFFERENTIAL/PLATELET  LIPASE, BLOOD  I-STAT BETA HCG BLOOD, ED ([facility], [facility], [facility] ONLY)    EKG EKG Interpretation  Date/Time:  Sunday August 12 2018 10:59:19 EST Ventricular  Rate:  122 PR Interval:    QRS Duration: 87 QT Interval:  292 QTC Calculation: 416 R Axis:   79 Text Interpretation:  Sinus tachycardia Consider right atrial enlargement Probable anteroseptal infarct, old Nonspecific T abnormalities, inferior leads No old tracing to compare Confirmed by Azalia Bilis ([id number]) on 08/12/2018 11:03:49 AM   Radiology US Abdomen Complete  Result Date: 08/12/2018 CLINICAL DATA:  Upper abdominal pain for several days EXAM: ABDOMEN ULTRASOUND COMPLETE COMPARISON:  None. FINDINGS: Gallbladder: Gallbladder is well distended with small gallbladder polyps. No calculi or obstructive changes are seen. Negative sonographic Murphy's sign is elicited. Common bile duct: Diameter: 6 mm. Liver: No focal lesion identified. Within normal limits in parenchymal echogenicity. Portal vein is patent on color Doppler imaging with normal direction of blood flow towards the liver. IVC: No abnormality visualized. Pancreas: Visualized portion unremarkable. Spleen: Size and appearance within normal limits. Right Kidney: Length: 11.8 cm. 1.1 cm nonobstructing calculus is noted in the midportion of the right kidney. Minimal fullness of the collecting system is seen. Left Kidney: Length: 11.7 cm. Echogenicity within normal limits. No mass or hydronephrosis visualized. Abdominal aorta: No aneurysm visualized. Other findings: None. IMPRESSION: Gallbladder polyps without complicating factors. Nonobstructing right renal stone as described. Minimal fullness of the right collecting system without definitive obstructive change. Electronically Signed   By: Alcide Clever M.D.   On: 08/12/2018 13:11    Procedures Procedures (including critical care time)  Medications Ordered in ED Medications  sodium chloride 0.9 % bolus 1,000 mL (1,000 mLs Intravenous New Bag/Given 08/12/18 1126)     Initial Impression / Assessment and Plan / ED Course  I have reviewed the triage vital signs and the nursing  notes.  Pertinent labs & imaging results that were available during my care of the patient were reviewed by me and considered in my medical decision making (see chart for details).     Work-up in the emergency department without significant abnormality.  Repeat abdominal exam without focal tenderness.  No peritonitis.  Feels better at this time.  Unclear etiology of her upper abdominal pain.  No indication for additional work-up or testing at this time.  I suspect she will continue to improve with time.  If she worsens she may benefit from CT imaging of her abdomen pelvis.  This was discussed at length with the patient.  She has a longstanding history of sinus tachycardia as well.  At some point she will need  outpatient cardiology follow-up.  She is been given these numbers.  She is overall well-appearing and nontoxic.  She understands to return to the ER for new or worsening symptoms  Final Clinical Impressions(s) / ED Diagnoses   Final diagnoses:  Left upper quadrant pain    ED Discharge Orders    None       Azalia Bilisampos, Sherree Shankman, MD 08/12/18 1355

## 2018-08-12 NOTE — ED Notes (Signed)
Pt transported to US

## 2020-04-28 IMAGING — US US ABDOMEN COMPLETE
1 series · 14 of 25 positions shown · non-contrast
Comparison: None.

CLINICAL DATA: Upper abdominal pain for several days

EXAM:
ABDOMEN ULTRASOUND COMPLETE

[Series 1: us abdomen complete · 0.20mm/px · 14 of 101 slices shown]
[im 1/101]
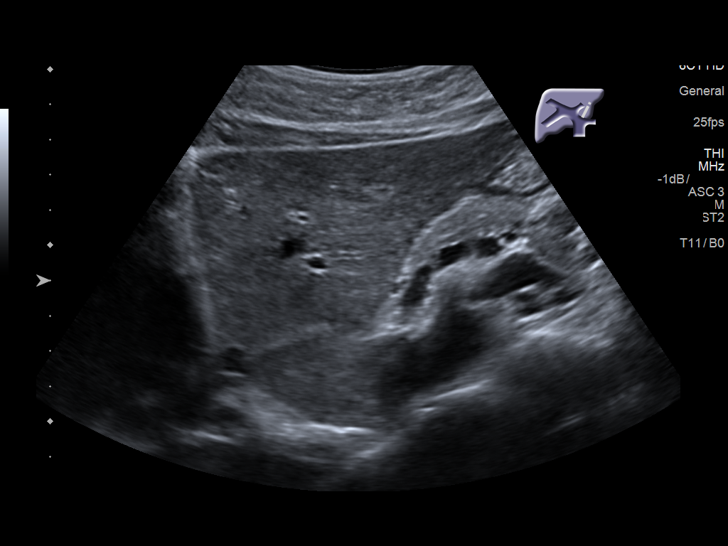
[im 9/101]
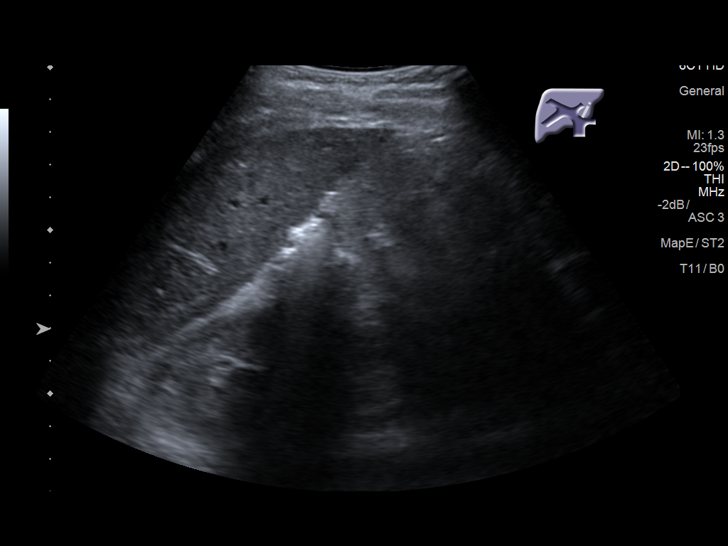
[im 17/101]
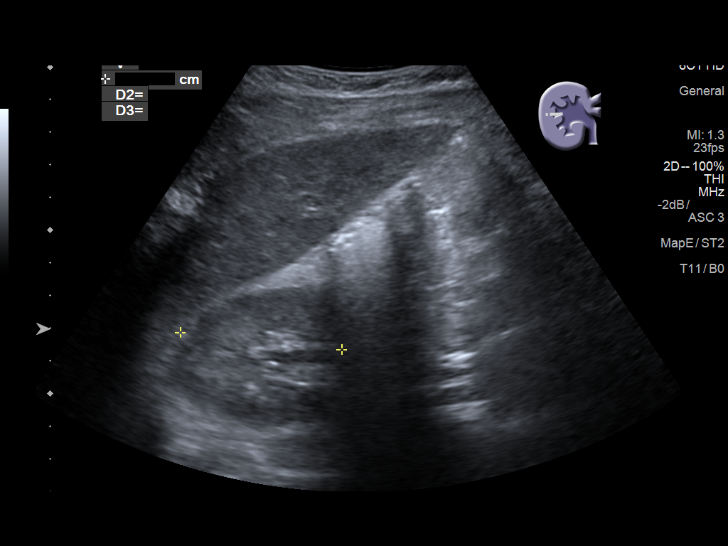
[im 26/101]
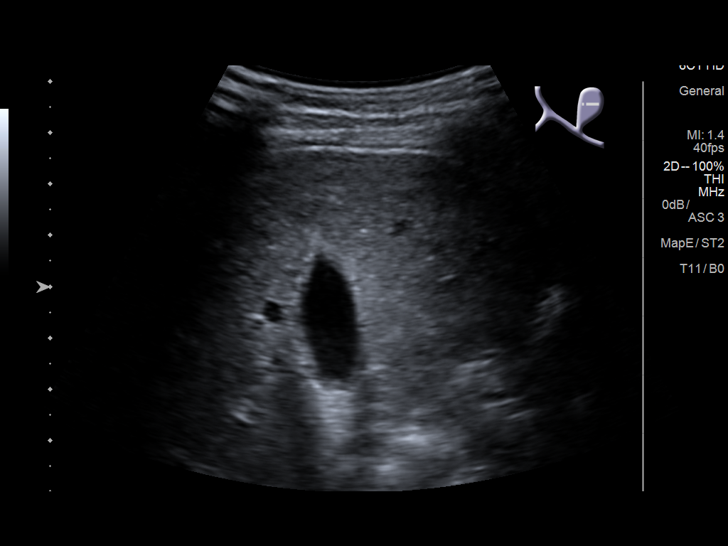
[im 34/101]
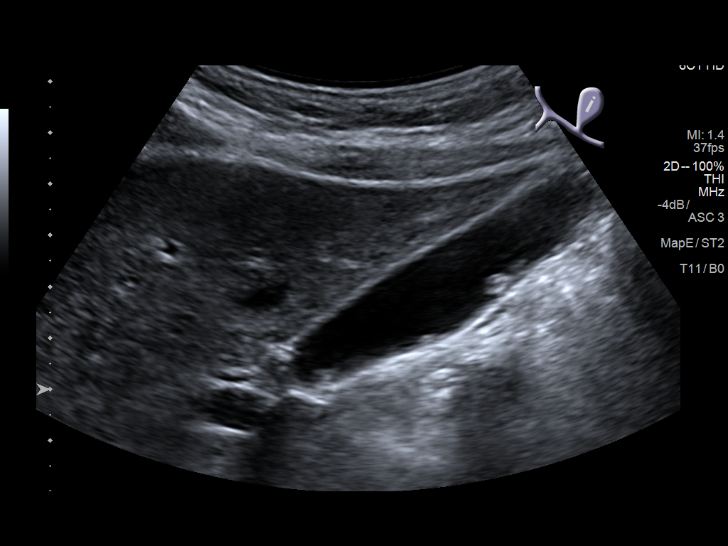
[im 38/101]
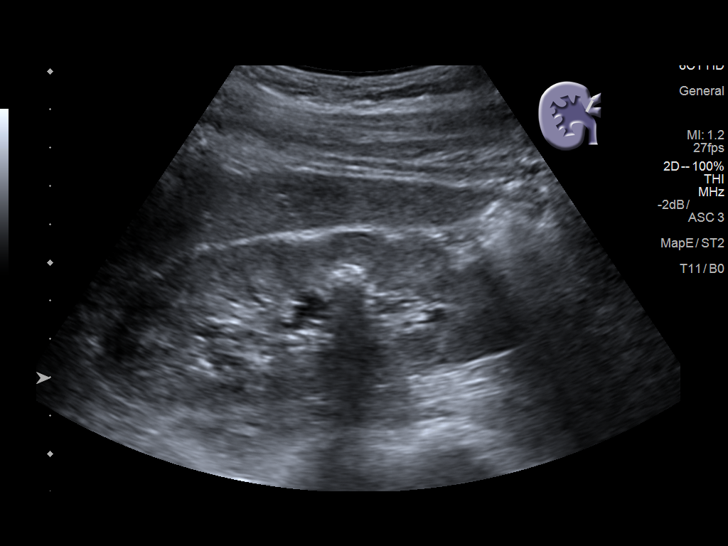
[im 46/101]
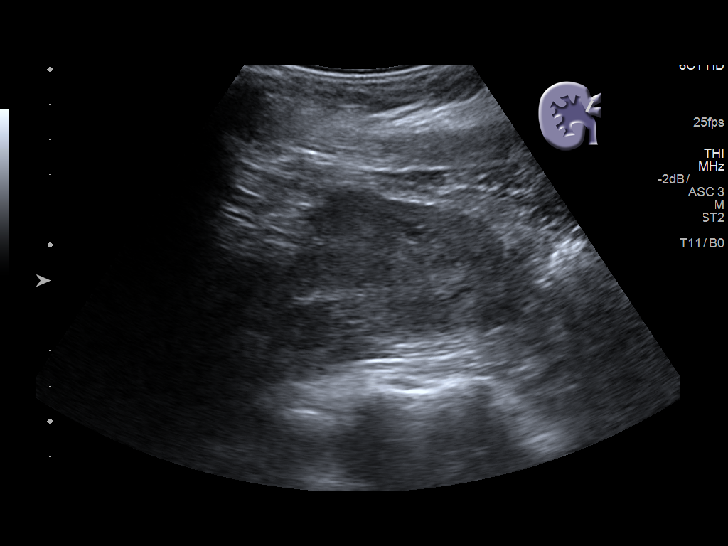
[im 55/101]
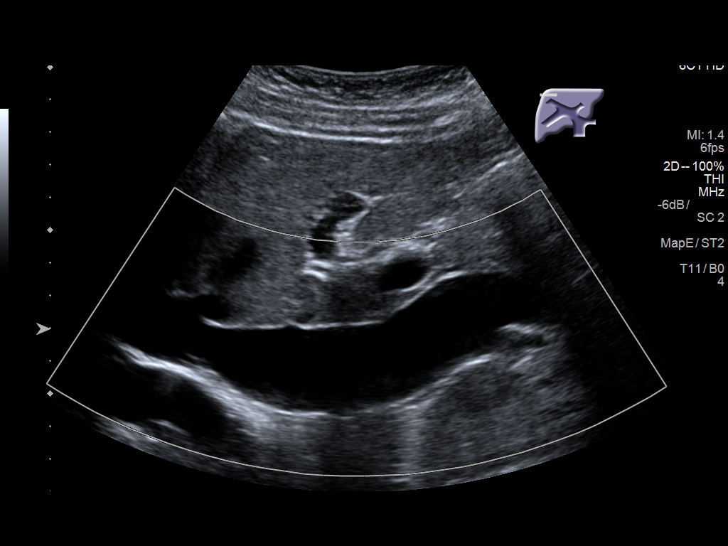
[im 63/101]
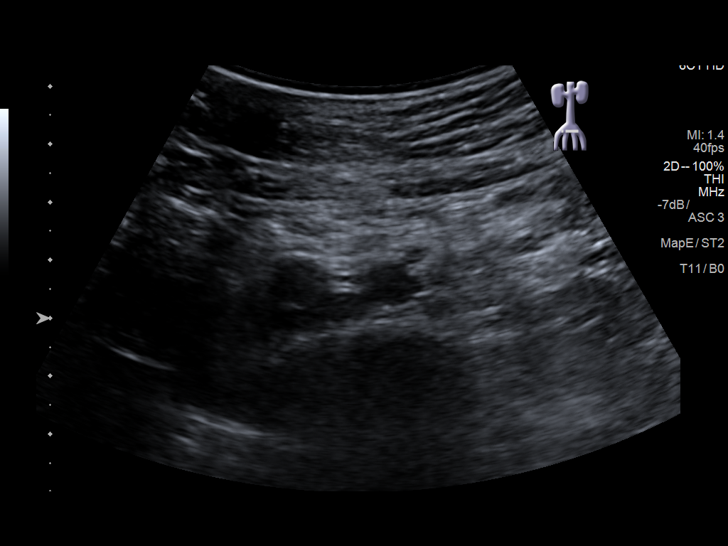
[im 67/101]
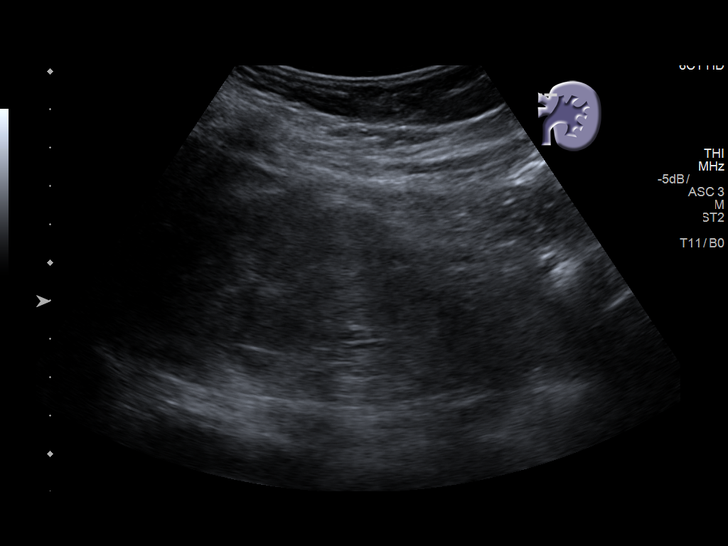
[im 76/101]
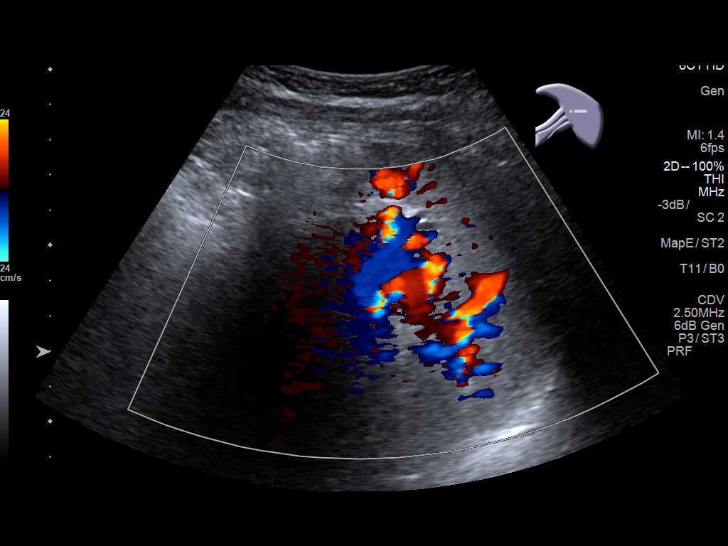
[im 84/101]
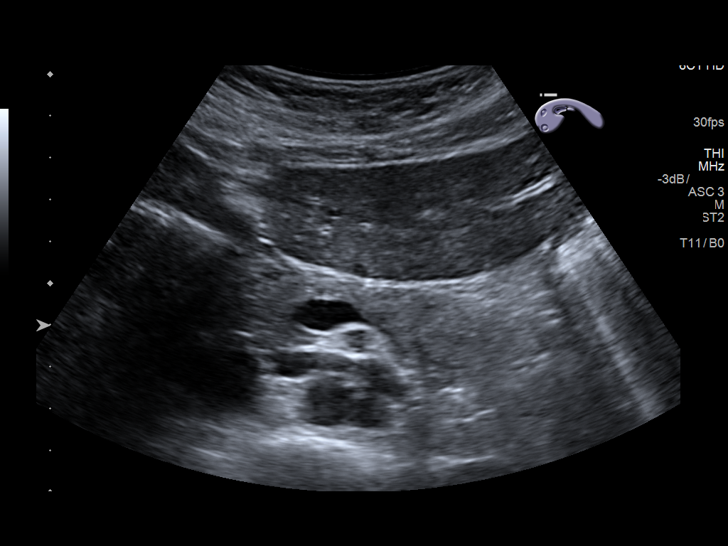
[im 92/101]
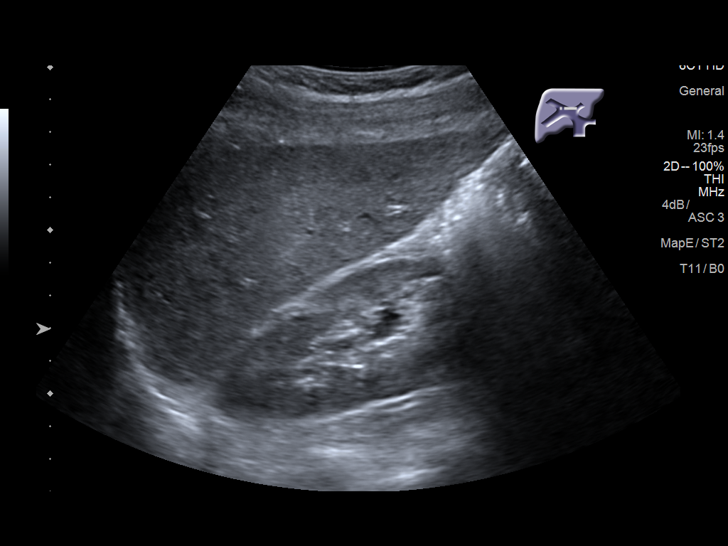
[im 101/101]
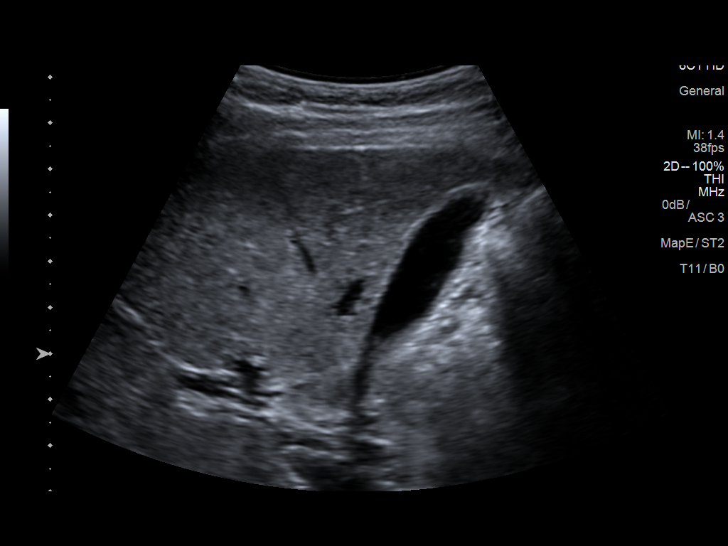

[14 of 25 positions shown; findings below may reference images not displayed]

FINDINGS: Gallbladder: Gallbladder is well distended with small gallbladder
polyps. No calculi or obstructive changes are seen. Negative
sonographic Murphy's sign is elicited.

Common bile duct: Diameter: 6 mm.

Liver: No focal lesion identified. Within normal limits in
parenchymal echogenicity. Portal vein is patent on color Doppler
imaging with normal direction of blood flow towards the liver.

IVC: No abnormality visualized.

Pancreas: Visualized portion unremarkable.

Spleen: Size and appearance within normal limits.

Right Kidney: Length: 11.8 cm.. 1.1 cm nonobstructing calculus is
noted in the midportion of the right kidney. Minimal fullness of the
collecting system is seen.

Left Kidney: Length: 11.7 cm. Echogenicity within normal limits. No
mass or hydronephrosis visualized.

Abdominal aorta: No aneurysm visualized.

Other findings: None.
IMPRESSION: Gallbladder polyps without complicating factors.

Nonobstructing right renal stone as described.

Minimal fullness of the right collecting system without definitive
obstructive change.
